# Patient Record
Sex: Female | Born: 1995 | Race: White | Hispanic: No | Marital: Single | Smoking: Never smoker
Health system: Southern US, Community
[De-identification: ages and names within clinical notes are randomized; demographics above are authoritative.]

## PROBLEM LIST (undated history)

## (undated) DIAGNOSIS — F32A Depression, unspecified: Secondary | ICD-10-CM

## (undated) DIAGNOSIS — G43909 Migraine, unspecified, not intractable, without status migrainosus: Secondary | ICD-10-CM

## (undated) DIAGNOSIS — F988 Other specified behavioral and emotional disorders with onset usually occurring in childhood and adolescence: Secondary | ICD-10-CM

## (undated) DIAGNOSIS — F329 Major depressive disorder, single episode, unspecified: Secondary | ICD-10-CM

## (undated) HISTORY — DX: Depression, unspecified: F32.A

## (undated) HISTORY — DX: Migraine, unspecified, not intractable, without status migrainosus: G43.909

## (undated) HISTORY — PX: WISDOM TOOTH EXTRACTION: SHX21

## (undated) HISTORY — DX: Other specified behavioral and emotional disorders with onset usually occurring in childhood and adolescence: F98.8

## (undated) HISTORY — DX: Major depressive disorder, single episode, unspecified: F32.9

## (undated) HISTORY — PX: TONSILLECTOMY: SUR1361

---
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
## 2017-08-18 ENCOUNTER — Encounter: Payer: Self-pay | Admitting: Obstetrics and Gynecology

## 2017-08-18 ENCOUNTER — Ambulatory Visit (INDEPENDENT_AMBULATORY_CARE_PROVIDER_SITE_OTHER): Payer: BC Managed Care – PPO | Admitting: Obstetrics and Gynecology

## 2017-08-18 VITALS — BP 98/62 | HR 83 | Ht 64.0 in | Wt 150.0 lb

## 2017-08-18 DIAGNOSIS — N926 Irregular menstruation, unspecified: Secondary | ICD-10-CM

## 2017-08-18 DIAGNOSIS — Z113 Encounter for screening for infections with a predominantly sexual mode of transmission: Secondary | ICD-10-CM | POA: Diagnosis not present

## 2017-08-18 DIAGNOSIS — Z30431 Encounter for routine checking of intrauterine contraceptive device: Secondary | ICD-10-CM

## 2017-08-18 DIAGNOSIS — N644 Mastodynia: Secondary | ICD-10-CM

## 2017-08-18 NOTE — Progress Notes (Signed)
Chief Complaint  Patient presents with  . abnormal bleeding    cycles lasting atleast 2 weeks since IUD placement    HPI:      Ms. Shannon Winters is a 21 y.o. No obstetric history on file. who LMP was Patient's last menstrual period was 08/17/2017., presents today for irregular bleeding since Kyleena placed 10/15/16. Pt's menses are monthly but last 1 1/2-2 wks. Flow is light, mild cramping. No pelvic pain, BTB otherwise. Occas pain in 1 certain position during sex. No new partners.   Pt has also noticed RT breast pain for the past 2 wks. Skin is tender to touch but she sometimes has sharp pains that last about 10 min. No masses. No trauma to breast. 1 cup caffeine daily. Pt wears jog bras usually and no change with them.    Past Medical History:  Diagnosis Date  . ADD (attention deficit disorder)   . Depression   . Migraine     Past Surgical History:  Procedure Laterality Date  . TONSILLECTOMY    . WISDOM TOOTH EXTRACTION      No family history on file.  Social History   Social History  . Marital status: Single    Spouse name: N/A  . Number of children: N/A  . Years of education: N/A   Occupational History  . Not on file.   Social History Main Topics  . Smoking status: Never Smoker  . Smokeless tobacco: Never Used  . Alcohol use No  . Drug use: No  . Sexual activity: Yes    Partners: Male    Birth control/ protection: IUD   Other Topics Concern  . Not on file   Social History Narrative  . No narrative on file     Current Outpatient Prescriptions:  .  Levonorgestrel (KYLEENA) 19.5 MG IUD, by Intrauterine route., Disp: , Rfl:  .  methylphenidate (METADATE ER) 20 MG ER tablet, , Disp: , Rfl:  .  methylphenidate (RITALIN) 10 MG tablet, , Disp: , Rfl:  .  sertraline (ZOLOFT) 50 MG tablet, , Disp: , Rfl:    ROS:  Review of Systems  Constitutional: Negative for fatigue, fever and unexpected weight change.  Respiratory: Negative for cough, shortness  of breath and wheezing.   Cardiovascular: Negative for chest pain, palpitations and leg swelling.  Gastrointestinal: Negative for blood in stool, constipation, diarrhea, nausea and vomiting.  Endocrine: Negative for cold intolerance, heat intolerance and polyuria.  Genitourinary: Positive for vaginal bleeding. Negative for dyspareunia, dysuria, flank pain, frequency, genital sores, hematuria, menstrual problem, pelvic pain, urgency, vaginal discharge and vaginal pain.  Musculoskeletal: Negative for back pain, joint swelling and myalgias.  Skin: Negative for rash.  Neurological: Negative for dizziness, syncope, light-headedness, numbness and headaches.  Hematological: Negative for adenopathy.  Psychiatric/Behavioral: Negative for agitation, confusion, sleep disturbance and suicidal ideas. The patient is not nervous/anxious.      OBJECTIVE:   Vitals:  BP 98/62 (BP Location: Left Arm, Patient Position: Sitting, Cuff Size: Normal)   Pulse 83   Ht  (1.626 m)   Wt 150 lb (68 kg)   LMP 08/17/2017   BMI 25.75 kg/m   Physical Exam  Constitutional: She is oriented to person, place, and time and well-developed, well-nourished, and in no distress. Vital signs are normal.  Pulmonary/Chest: Right breast exhibits no inverted nipple, no mass, no nipple discharge, no skin change and no tenderness. Left breast exhibits no inverted nipple, no mass, no nipple discharge, no skin  change and no tenderness. Breasts are symmetrical.    RT BREAST TENDERNESS; NO MASSES OR ERYTHEMA  Genitourinary: Vagina normal, uterus normal, cervix normal, right adnexa normal, left adnexa normal and vulva normal. Uterus is not enlarged. Cervix exhibits no motion tenderness and no tenderness. Right adnexum displays no mass and no tenderness. Left adnexum displays no mass and no tenderness. Vulva exhibits no erythema, no exudate, no lesion, no rash and no tenderness. Vagina exhibits no lesion.  Lymphadenopathy:    She has  no axillary adenopathy.  Neurological: She is alert and oriented to person, place, and time.  Psychiatric: Affect and judgment normal.  Vitals reviewed.   Assessment/Plan: Irregular menstrual bleeding - With IUD. Check gon/chlam. If neg, reassurance. F/u prn.  - Plan: Chlamydia/Gonococcus/Trichomonas, NAA  Encounter for routine checking of intrauterine contraceptive device (IUD) - IUD in place.  Screening for STD (sexually transmitted disease) - Plan: Chlamydia/Gonococcus/Trichomonas, NAA  Breast pain, right - Neg exam. Question rib vs breast. D/C caffeine. F/u prn.     Return if symptoms worsen or fail to improve.  Alphonse Asbridge B. Kynlee Koenigsberg, PA-C 08/18/2017 10:23 AM

## 2017-08-21 LAB — CHLAMYDIA/GONOCOCCUS/TRICHOMONAS, NAA
Chlamydia by NAA: NEGATIVE
Gonococcus by NAA: NEGATIVE
TRICH VAG BY NAA: NEGATIVE

## 2017-11-09 ENCOUNTER — Encounter: Payer: Self-pay | Admitting: Obstetrics and Gynecology

## 2017-11-09 ENCOUNTER — Ambulatory Visit: Payer: BC Managed Care – PPO | Admitting: Obstetrics and Gynecology

## 2017-11-09 VITALS — BP 120/70 | Ht 64.0 in | Wt 152.0 lb

## 2017-11-09 DIAGNOSIS — Z30432 Encounter for removal of intrauterine contraceptive device: Secondary | ICD-10-CM

## 2017-11-09 DIAGNOSIS — Z30011 Encounter for initial prescription of contraceptive pills: Secondary | ICD-10-CM

## 2017-11-09 MED ORDER — NORETHIN-ETH ESTRAD-FE BIPHAS 1 MG-10 MCG / 10 MCG PO TABS: 1.0000 | ORAL_TABLET | Freq: Every day | ORAL | 1 refills | Status: DC

## 2017-11-09 NOTE — Progress Notes (Signed)
   Chief Complaint  Patient presents with  . Contraception    IUD removal     History of Present Illness:  Shannon Winters is a 21 y.o. that had a [place] IUD placed approximately 1 years ago. Since that time, she has had monthly menses lasting 1 1/2- 2 wks, and bled the whole month this past menses. She has had cramping this month as well. She is tired of the bleeding and wants to restart OCPs. She took them in the past without any problems.  She is sex active, no new partners. Had neg STD testing 10/18 due to DUB sx.   BP 120/70   Ht 5\' 4"  (1.626 m)   Wt 152 lb (68.9 kg)   LMP 10/14/2017 (Exact Date)   BMI 26.09 kg/m   Pelvic exam:  Two IUD strings not seen coming from the cervical os. EGBUS, vaginal vault and cervix: within normal limits  IUD Removal Strings of IUD identified and grasped.  IUD removed without problem with Boseman forceps.  Pt tolerated this well.  IUD noted to be intact.  Assessment:  Encounter for IUD removal  Encounter for initial prescription of contraceptive pills - 2 samples Lo Loestrin given to pt. RTO in 6 wks for annual/DUB f/u. Condoms.   Meds ordered this encounter  Medications  . Norethindrone-Ethinyl Estradiol-Fe Biphas (LO LOESTRIN FE) 1 MG-10 MCG / 10 MCG tablet    Sig: Take 1 tablet by mouth daily. 2 SAMPLES GIVEN.    Dispense:  28 tablet    Refill:  1     Plan: IUD removed and plan for contraception is oral contraceptives (estrogen/progesterone). She was amenable to this plan.  Alicia B. Copland, PA-C 11/09/2017 2:37 PM

## 2017-11-09 NOTE — Addendum Note (Signed)
Addended by: Althea GrimmerOPLAND, ALICIA B on: 11/09/2017 05:05 PM   Modules accepted: Orders

## 2017-11-09 NOTE — Patient Instructions (Signed)
I value your feedback and entrusting us with your care. If you get a [facility] patient survey, I would appreciate you taking the time to let us know about your experience today. Thank you! 

## 2017-11-09 NOTE — Addendum Note (Signed)
Addended by: Althea GrimmerOPLAND, Cherre Kothari B on: 11/09/2017 05:06 PM   Modules accepted: Orders

## 2018-01-02 ENCOUNTER — Other Ambulatory Visit: Payer: Self-pay

## 2018-01-02 ENCOUNTER — Ambulatory Visit (INDEPENDENT_AMBULATORY_CARE_PROVIDER_SITE_OTHER): Payer: BC Managed Care – PPO | Admitting: Obstetrics and Gynecology

## 2018-01-02 VITALS — BP 110/70 | HR 72 | Ht 64.0 in | Wt 157.0 lb

## 2018-01-02 DIAGNOSIS — Z3041 Encounter for surveillance of contraceptive pills: Secondary | ICD-10-CM

## 2018-01-02 DIAGNOSIS — Z803 Family history of malignant neoplasm of breast: Secondary | ICD-10-CM | POA: Diagnosis not present

## 2018-01-02 DIAGNOSIS — Z01419 Encounter for gynecological examination (general) (routine) without abnormal findings: Secondary | ICD-10-CM | POA: Diagnosis not present

## 2018-01-02 DIAGNOSIS — N898 Other specified noninflammatory disorders of vagina: Secondary | ICD-10-CM

## 2018-01-02 DIAGNOSIS — Z113 Encounter for screening for infections with a predominantly sexual mode of transmission: Secondary | ICD-10-CM

## 2018-01-02 LAB — POCT WET PREP WITH KOH
Clue Cells Wet Prep HPF POC: NEGATIVE
KOH PREP POC: NEGATIVE
Trichomonas, UA: NEGATIVE
Yeast Wet Prep HPF POC: NEGATIVE

## 2018-01-02 MED ORDER — TERCONAZOLE 0.8 % VA CREA: 1.0000 | TOPICAL_CREAM | Freq: Every day | VAGINAL | 1 refills | Status: AC

## 2018-01-02 MED ORDER — NORETHIN-ETH ESTRAD-FE BIPHAS 1 MG-10 MCG / 10 MCG PO TABS: 1.0000 | ORAL_TABLET | Freq: Every day | ORAL | 3 refills | Status: AC

## 2018-01-02 NOTE — Patient Instructions (Signed)
I value your feedback and entrusting us with your care. If you get a [facility] patient survey, I would appreciate you taking the time to let us know about your experience today. Thank you! 

## 2018-01-02 NOTE — Progress Notes (Signed)
PCP:  Patient, No Pcp Per   Chief Complaint  Patient presents with  . Gynecologic Exam    Irregular cycles since on OCP's     HPI:      Ms. Caleen EssexKatherine L Rundle is a 22 y.o. [id number] who LMP was Patient's last menstrual period was 12/28/2017., presents today for her annual examination.  Her menses are infrequent and light since starting Lo Loestrin 12/18. Was having frequent bleeding with IUD that was removed then.  Dysmenorrhea none. She denies any other side effects.  Sex activity: single partner, contraception - OCP (estrogen/progesterone) and condoms  Hx of STDs: none  She complains of vaginal itching and burning with sex the other night. No increased d/c, odor. Sx for the past few days. Has tried ext monistat crm. States she gets frequent yeast vag and treats with OTC meds with sx relief. No recent abx use. Uses dove sens skin soap and dryer sheets.  There is a FH of breast cancer in her mat and pat aunts, genetic testing not done. There is no FH of ovarian cancer. The patient does do self-breast exams.  Tobacco use: The patient denies current or previous tobacco use. Alcohol use: none No drug use.  Exercise: moderately active  She does get adequate calcium and Vitamin D in her diet.   Past Medical History:  Diagnosis Date  . ADD (attention deficit disorder)   . Depression   . Migraine     Past Surgical History:  Procedure Laterality Date  . TONSILLECTOMY    . WISDOM TOOTH EXTRACTION      Family History  Problem Relation Age of Onset  . Breast cancer Maternal Aunt [id number]  . Breast cancer Paternal Aunt [id number]    Social History   Socioeconomic History  . Marital status: Single    Spouse name: Not on file  . Number of children: Not on file  . Years of education: Not on file  . Highest education level: Not on file  Social Needs  . Financial resource strain: Not on file  . Food insecurity - worry: Not on file  . Food insecurity - inability: Not on file  .  Transportation needs - medical: Not on file  . Transportation needs - non-medical: Not on file  Occupational History  . Not on file  Tobacco Use  . Smoking status: Never Smoker  . Smokeless tobacco: Never Used  Substance and Sexual Activity  . Alcohol use: No  . Drug use: No  . Sexual activity: Yes    Partners: Male    Birth control/protection: OCP  Other Topics Concern  . Not on file  Social History Narrative  . Not on file    Current Meds  Medication Sig  . methylphenidate (METADATE ER) 20 MG ER tablet   . methylphenidate (RITALIN) 10 MG tablet   . Norethindrone-Ethinyl Estradiol-Fe Biphas (LO LOESTRIN FE) 1 MG-10 MCG / 10 MCG tablet Take 1 tablet by mouth daily.  . sertraline (ZOLOFT) 100 MG tablet Take 100 mg by mouth daily.  . [DISCONTINUED] Norethindrone-Ethinyl Estradiol-Fe Biphas (LO LOESTRIN FE) 1 MG-10 MCG / 10 MCG tablet Take 1 tablet by mouth daily. 2 SAMPLES GIVEN.     ROS:  Review of Systems  Constitutional: Negative for fatigue, fever and unexpected weight change.  Respiratory: Negative for cough, shortness of breath and wheezing.   Cardiovascular: Negative for chest pain, palpitations and leg swelling.  Gastrointestinal: Negative for blood in stool, constipation, diarrhea, nausea and vomiting.  Endocrine: Negative for cold intolerance, heat intolerance and polyuria.  Genitourinary: Negative for dyspareunia, dysuria, flank pain, frequency, genital sores, hematuria, menstrual problem, pelvic pain, urgency, vaginal bleeding, vaginal discharge and vaginal pain.  Musculoskeletal: Negative for back pain, joint swelling and myalgias.  Skin: Negative for rash.  Neurological: Negative for dizziness, syncope, light-headedness, numbness and headaches.  Hematological: Negative for adenopathy.  Psychiatric/Behavioral: Negative for agitation, confusion, sleep disturbance and suicidal ideas. The patient is not nervous/anxious.      Objective: BP 110/70 (BP Location:  Left Arm, Patient Position: Sitting, Cuff Size: Normal)   Pulse 72   Ht 5\' 4"  (1.626 m)   Wt 157 lb (71.2 kg)   LMP 12/28/2017   BMI 26.95 kg/m    Physical Exam  Constitutional: She is oriented to person, place, and time. She appears well-developed and well-nourished.  Genitourinary: Vagina normal and uterus normal. There is no rash or tenderness on the right labia. There is no rash or tenderness on the left labia. No erythema or tenderness in the vagina. No vaginal discharge found. Right adnexum does not display mass and does not display tenderness. Left adnexum does not display mass and does not display tenderness. Cervix does not exhibit motion tenderness or polyp. Uterus is not enlarged or tender.  Neck: Normal range of motion. No thyromegaly present.  Cardiovascular: Normal rate, regular rhythm and normal heart sounds.  No murmur heard. Pulmonary/Chest: Effort normal and breath sounds normal. Right breast exhibits no mass, no nipple discharge, no skin change and no tenderness. Left breast exhibits no mass, no nipple discharge, no skin change and no tenderness.  Abdominal: Soft. There is no tenderness. There is no guarding.  Musculoskeletal: Normal range of motion.  Neurological: She is alert and oriented to person, place, and time. No cranial nerve deficit.  Psychiatric: She has a normal mood and affect. Her behavior is normal.  Vitals reviewed.   Results: Results for orders placed or performed in visit on 01/02/18 (from the past 24 hour(s))  POCT Wet Prep with KOH     Status: Normal   Collection Time: 01/02/18  1:29 PM  Result Value Ref Range   Trichomonas, UA Negative    Clue Cells Wet Prep HPF POC neg    Epithelial Wet Prep HPF POC  Few, Moderate, Many, Too numerous to count   Yeast Wet Prep HPF POC neg    Bacteria Wet Prep HPF POC  Few   RBC Wet Prep HPF POC     WBC Wet Prep HPF POC     KOH Prep POC Negative Negative    Assessment/Plan: Encounter for annual routine  gynecological examination  Screening for STD (sexually transmitted disease) - Plan: Chlamydia/Gonococcus/Trichomonas, NAA  Encounter for surveillance of contraceptive pills - OCP RF. F/u prn DUB. - Plan: Norethindrone-Ethinyl Estradiol-Fe Biphas (LO LOESTRIN FE) 1 MG-10 MCG / 10 MCG tablet  Family history of breast cancer - MyRisk testing mentioned and will discuss further next yt at age 16  Vaginal itching - Neg wet prep (pt has been using monistat). Rx terazol. F/u prn. Line dry underwear - Plan: POCT Wet Prep with KOH, terconazole (TERAZOL 3) 0.8 % vaginal cream  Meds ordered this encounter  Medications  . terconazole (TERAZOL 3) 0.8 % vaginal cream    Sig: Place 1 applicator vaginally at bedtime for 3 days.    Dispense:  20 g    Refill:  1    Order Specific Question:   Supervising Provider  AnswerNadara Mustard [[id number]]  . Norethindrone-Ethinyl Estradiol-Fe Biphas (LO LOESTRIN FE) 1 MG-10 MCG / 10 MCG tablet    Sig: Take 1 tablet by mouth daily.    Dispense:  84 tablet    Refill:  3    Order Specific Question:   Supervising Provider    Answer:   Nadara Mustard [[id number]]             GYN counsel breast self exam, use and side effects of OCP's, adequate intake of calcium and vitamin D, diet and exercise     F/U  Return in about 1 year (around 01/02/2019).  Cannon Quinton B. Srah Ake, PA-C 01/02/2018 1:33 PM

## 2018-01-04 LAB — CHLAMYDIA/GONOCOCCUS/TRICHOMONAS, NAA
Chlamydia by NAA: NEGATIVE
Gonococcus by NAA: NEGATIVE
Trich vag by NAA: NEGATIVE

## 2018-11-29 ENCOUNTER — Other Ambulatory Visit: Payer: Self-pay | Admitting: Obstetrics and Gynecology

## 2018-11-29 DIAGNOSIS — Z3041 Encounter for surveillance of contraceptive pills: Secondary | ICD-10-CM
